# Patient Record
Sex: Female | Born: 2000 | Race: Black or African American | Hispanic: No | Marital: Single | Smoking: Never smoker
Health system: Southern US, Community
[De-identification: ages and names within clinical notes are randomized; demographics above are authoritative.]

---
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
## 2018-01-03 ENCOUNTER — Emergency Department (HOSPITAL_BASED_OUTPATIENT_CLINIC_OR_DEPARTMENT_OTHER)
Admission: EM | Admit: 2018-01-03 | Discharge: 2018-01-03 | Discharge status: Absent without leave | Payer: Medicaid Other
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
